# Patient Record
Sex: Female | Born: 1980 | Race: White | Hispanic: Yes | Marital: Single | Smoking: Never smoker
Health system: Southern US, Community
[De-identification: ages and names within clinical notes are randomized; demographics above are authoritative.]

## PROBLEM LIST (undated history)

## (undated) DIAGNOSIS — G4730 Sleep apnea, unspecified: Secondary | ICD-10-CM

## (undated) DIAGNOSIS — F419 Anxiety disorder, unspecified: Secondary | ICD-10-CM

## (undated) DIAGNOSIS — M549 Dorsalgia, unspecified: Secondary | ICD-10-CM

## (undated) DIAGNOSIS — IMO0002 Reserved for concepts with insufficient information to code with codable children: Secondary | ICD-10-CM

## (undated) HISTORY — DX: Sleep apnea, unspecified: G47.30

## (undated) HISTORY — DX: Dorsalgia, unspecified: M54.9

## (undated) HISTORY — PX: GASTRIC RESTRICTION SURGERY: SHX653

## (undated) HISTORY — DX: Reserved for concepts with insufficient information to code with codable children: IMO0002

## (undated) HISTORY — DX: Anxiety disorder, unspecified: F41.9

---

## 2004-09-28 ENCOUNTER — Ambulatory Visit (HOSPITAL_BASED_OUTPATIENT_CLINIC_OR_DEPARTMENT_OTHER): Admission: RE | Admit: 2004-09-28 | Discharge: 2004-09-28 | Payer: Self-pay | Admitting: Otolaryngology

## 2006-03-09 ENCOUNTER — Ambulatory Visit (HOSPITAL_COMMUNITY): Admission: RE | Admit: 2006-03-09 | Discharge: 2006-03-09 | Payer: Self-pay | Admitting: Internal Medicine

## 2006-07-26 ENCOUNTER — Ambulatory Visit (HOSPITAL_BASED_OUTPATIENT_CLINIC_OR_DEPARTMENT_OTHER): Admission: RE | Admit: 2006-07-26 | Discharge: 2006-07-26 | Payer: Self-pay | Admitting: Otolaryngology

## 2006-08-04 ENCOUNTER — Ambulatory Visit: Payer: Self-pay | Admitting: Internal Medicine

## 2010-04-22 ENCOUNTER — Encounter: Admission: RE | Admit: 2010-04-22 | Discharge: 2010-06-01 | Payer: Self-pay | Admitting: General Surgery

## 2010-08-04 ENCOUNTER — Ambulatory Visit (HOSPITAL_COMMUNITY)
Admission: RE | Admit: 2010-08-04 | Discharge: 2010-08-04 | Payer: Self-pay | Source: Home / Self Care | Admitting: General Surgery

## 2010-08-24 ENCOUNTER — Encounter
Admission: RE | Admit: 2010-08-24 | Discharge: 2010-10-03 | Payer: Self-pay | Source: Home / Self Care | Attending: General Surgery | Admitting: General Surgery

## 2010-09-12 ENCOUNTER — Ambulatory Visit (HOSPITAL_COMMUNITY)
Admission: RE | Admit: 2010-09-12 | Discharge: 2010-09-14 | Payer: Self-pay | Source: Home / Self Care | Attending: General Surgery | Admitting: General Surgery

## 2010-09-18 LAB — CBC
HCT: 33 % — ABNORMAL LOW (ref 36.0–46.0)
HCT: 34.7 % — ABNORMAL LOW (ref 36.0–46.0)
Hemoglobin: 11.1 g/dL — ABNORMAL LOW (ref 12.0–15.0)
Hemoglobin: 11.5 g/dL — ABNORMAL LOW (ref 12.0–15.0)
MCH: 29.9 pg (ref 26.0–34.0)
MCH: 29.6 pg (ref 26.0–34.0)
MCHC: 33.6 g/dL (ref 30.0–36.0)
MCHC: 33.1 g/dL (ref 30.0–36.0)
MCV: 88.9 fL (ref 78.0–100.0)
MCV: 89.2 fL (ref 78.0–100.0)
Platelets: 287 10*3/uL (ref 150–400)
Platelets: 295 10*3/uL (ref 150–400)
RBC: 3.71 MIL/uL — ABNORMAL LOW (ref 3.87–5.11)
RBC: 3.89 MIL/uL (ref 3.87–5.11)
RDW: 13.4 % (ref 11.5–15.5)
RDW: 13.4 % (ref 11.5–15.5)
WBC: 11.9 10*3/uL — ABNORMAL HIGH (ref 4.0–10.5)
WBC: 9.8 10*3/uL (ref 4.0–10.5)

## 2010-09-18 LAB — DIFFERENTIAL
Basophils Absolute: 0 10*3/uL (ref 0.0–0.1)
Basophils Relative: 0 % (ref 0–1)
Eosinophils Absolute: 0 10*3/uL (ref 0.0–0.7)
Eosinophils Relative: 0 % (ref 0–5)
Lymphocytes Relative: 28 % (ref 12–46)
Lymphs Abs: 3.3 10*3/uL (ref 0.7–4.0)
Monocytes Absolute: 0.9 10*3/uL (ref 0.1–1.0)
Monocytes Relative: 8 % (ref 3–12)
Neutro Abs: 7.7 10*3/uL (ref 1.7–7.7)
Neutrophils Relative %: 64 % (ref 43–77)

## 2010-09-18 LAB — POTASSIUM: Potassium: 4.1 mEq/L (ref 3.5–5.1)

## 2010-09-24 ENCOUNTER — Encounter: Payer: Self-pay | Admitting: General Surgery

## 2010-10-03 ENCOUNTER — Encounter: Admit: 2010-10-03 | Discharge: 2010-10-03 | Payer: Self-pay | Attending: General Surgery | Admitting: General Surgery

## 2010-11-06 ENCOUNTER — Ambulatory Visit: Payer: Self-pay | Admitting: *Deleted

## 2010-11-13 LAB — COMPREHENSIVE METABOLIC PANEL
ALT: 11 U/L (ref 0–35)
AST: 16 U/L (ref 0–37)
Albumin: 3.7 g/dL (ref 3.5–5.2)
Alkaline Phosphatase: 66 U/L (ref 39–117)
BUN: 10 mg/dL (ref 6–23)
CO2: 26 mEq/L (ref 19–32)
Calcium: 8.9 mg/dL (ref 8.4–10.5)
Chloride: 102 mEq/L (ref 96–112)
Creatinine, Ser: 0.64 mg/dL (ref 0.4–1.2)
GFR calc Af Amer: >60 mL/min (ref >60)
GFR calc non Af Amer: >60 mL/min (ref >60)
Glucose, Bld: 75 mg/dL (ref 70–99)
Potassium: 3.3 mEq/L — ABNORMAL LOW (ref 3.5–5.1)
Sodium: 137 mEq/L (ref 135–145)
Total Bilirubin: 0.4 mg/dL (ref 0.3–1.2)
Total Protein: 7.2 g/dL (ref 6.0–8.3)

## 2010-11-13 LAB — CBC
HCT: 37.4 % (ref 36.0–46.0)
Hemoglobin: 12.3 g/dL (ref 12.0–15.0)
MCH: 29.6 pg (ref 26.0–34.0)
MCHC: 32.9 g/dL (ref 30.0–36.0)
MCV: 89.9 fL (ref 78.0–100.0)
Platelets: 345 10*3/uL (ref 150–400)
RBC: 4.16 MIL/uL (ref 3.87–5.11)
RDW: 13.4 % (ref 11.5–15.5)
WBC: 7.9 10*3/uL (ref 4.0–10.5)

## 2010-11-13 LAB — DIFFERENTIAL
Basophils Absolute: 0 10*3/uL (ref 0.0–0.1)
Basophils Relative: 1 % (ref 0–1)
Eosinophils Absolute: 0.4 10*3/uL (ref 0.0–0.7)
Eosinophils Relative: 5 % (ref 0–5)
Lymphocytes Relative: 36 % (ref 12–46)
Lymphs Abs: 2.8 10*3/uL (ref 0.7–4.0)
Monocytes Absolute: 0.5 10*3/uL (ref 0.1–1.0)
Monocytes Relative: 6 % (ref 3–12)
Neutro Abs: 4.2 10*3/uL (ref 1.7–7.7)
Neutrophils Relative %: 53 % (ref 43–77)

## 2010-11-13 LAB — SURGICAL PCR SCREEN
MRSA, PCR: NEGATIVE
Staphylococcus aureus: NEGATIVE

## 2010-11-13 LAB — PREGNANCY, URINE: Preg Test, Ur: NEGATIVE

## 2010-11-28 ENCOUNTER — Encounter: Payer: Medicare HMO | Attending: General Surgery | Admitting: *Deleted

## 2010-11-28 DIAGNOSIS — Z9884 Bariatric surgery status: Secondary | ICD-10-CM | POA: Insufficient documentation

## 2010-11-28 DIAGNOSIS — Z09 Encounter for follow-up examination after completed treatment for conditions other than malignant neoplasm: Secondary | ICD-10-CM | POA: Insufficient documentation

## 2010-11-28 DIAGNOSIS — Z713 Dietary counseling and surveillance: Secondary | ICD-10-CM | POA: Insufficient documentation

## 2011-01-10 ENCOUNTER — Ambulatory Visit: Payer: Medicare HMO | Admitting: *Deleted

## 2011-01-19 NOTE — Procedures (Signed)
NAMECOLINE, Anna Pitts                  ACCOUNT NO.:  [id number]   MEDICAL RECORD NO.:  [id number]          PATIENT TYPE:  OUT   LOCATION:  SLEEP CENTER                 FACILITY:  [facility]   PHYSICIAN:  Clinton D. Maple Hudson, M.D. DATE OF BIRTH:  11-24-1980   DATE OF STUDY:                              NOCTURNAL POLYSOMNOGRAM   STUDY DATE:  September 28, 2004   REFERRING PHYSICIAN:  Dr. Suzanna Obey   INDICATION FOR STUDY:  Hypersomnia with sleep apnea.  Epworth Sleepiness  Score 19/24.  BMI 38.9.  Weight 220 pounds.   SLEEP ARCHITECTURE:  Total sleep time 327 minutes with sleep efficiency 72%.  Stage I was 15%, stage II 58%, stages III and IV 15%, REM was 12% of total  sleep time.  Sleep latency 31 minutes, REM latency 185 minutes, awake after  sleep onset 96 minutes, arousal index increased at 36.  No medications were  taken.   RESPIRATORY DATA:  RDI 62.2 obstructive events per hour indicating severe  obstructive sleep apnea/hypopnea syndrome.  This included 2 central apneas,  103 obstructive apneas, 234 hypopneas.  Events were not positional.  REM RDI  77 per hour.  Split-study protocol CPAP titration could not be carried out  because the patient could not achieve the required 2 hours of sleep before  1:30 a.m. to allow time for CPAP titration with confirmed diagnosis.  Most  obstructive events developed after 2 a.m.   OXYGEN DATA:  Moderate snoring with oxygen desaturation to a nadir of 85%.  Mean oxygen saturation through the study was 97% on room air.   CARDIAC DATA:  Normal sinus rhythm.   MOVEMENT/PARASOMNIA:  Occasional leg jerks with insignificant effect on  sleep.   IMPRESSION/RECOMMENDATION:  Severe obstructive sleep apnea/hypopnea  syndrome, RDI 62.2 per hour with oxygen desaturation to 85% during events.  Patient unable to sustain sleep initially for continuous positive airway  pressure titration.  Consider return for continuous positive airway pressure  titration study or  evaluate for alternative therapies as appropriate.     CDY/MEDQ  D:  10/01/2004 13:13:56  T:  10/01/2004 20:52:07  Job:  [id number]

## 2011-01-19 NOTE — Procedures (Signed)
Anna Pitts, Anna Pitts                  ACCOUNT NO.:  [id number]   MEDICAL RECORD NO.:  [id number]          PATIENT TYPE:  OUT   LOCATION:  SLEEP CENTER                 FACILITY:  [facility]   PHYSICIAN:  Clinton D. Maple Hudson, MD, FCCP, FACPDATE OF BIRTH:  February 16[birth year] 1981   DATE OF STUDY:  07/26/2006                            NOCTURNAL POLYSOMNOGRAM   INDICATION FOR STUDY:  Hypersomnia with sleep apnea.   EPWORTH SLEEPINESS SCORE:  12/24, BMI 40.7, weight 230 pounds.   HOME MEDICATIONS:  1. Birth control pills.  2. Advil.  3. Allergy medication.   MEDICATIONS:  No bedtime medication was reported.   A diagnostic NPSG on September 28, 2004 recorded an AHI of 62.2 per hour.   SLEEP ARCHITECTURE:  Total sleep time 391 minutes with sleep efficiency  84%.  Stage 1 was 8%, stage 2 62%, stages 3 and 4 17%, REM 13% of total  sleep time.  Sleep latency 10 minutes.  REM latency 134 minutes.  Awake  after sleep onset 64 minutes. Arousal index 32 indicating increased  sleep fragmentation in the first half of the night before CPAP.   RESPIRATORY DATA:  Split study protocol.  Apnea/hypopnea index (AHI/RDI)  38 per hour indicating moderate obstructive sleep apnea/hypopnea  syndrome before CPAP.  This included 95 obstructive apnea's and 47  hypopnea's before CPAP.  The events were not positional.  REM AHI 0.  CPAP was titrated to 10 CWP, AHI 0 per hour.  A petite Respironics'  Comfort Gel mask was used with heated humidifier.   OXYGEN DATA:  Moderate snoring with oxygen desaturation to a nadir of  83%.  Mean oxygen saturation after CPAP control 97% on room air.   CARDIAC DATA:  Normal sinus rhythm.   MOVEMENT-PARASOMNIA:  A total of 78 limb jerks were recorded of which 27  were associated with arousal or awakening for periodic limb movement  with arousal index of 4.1 per hour which is mildly increased.   IMPRESSIONS-RECOMMENDATIONS:  1. Moderate obstructive sleep apnea/hypopnea syndrome, AHI 38 per hour   with non positional events, moderate snoring and oxygen      desaturation to 83%.  2. Successful CPAP titration to 10 CWP, AHI 0 per hour.  A petite      Respironics' Comfort Gel mask was used with a heated humidifier.  3. Prior diagnostic NPSG on September 28, 2004 recorded an AHI of 62.2      per hour.  4. Periodic limb movement with arousal, 4.1 per hour.      Clinton D. Maple Hudson, MD, [facility], FACP  Diplomate, Biomedical engineer of Sleep Medicine  Electronically Signed     CDY/MEDQ  D:  08/04/2006 13:28:26  T:  08/05/2006 11:03:14  Job:  [id number]

## 2011-02-09 ENCOUNTER — Encounter (INDEPENDENT_AMBULATORY_CARE_PROVIDER_SITE_OTHER): Payer: Self-pay | Admitting: General Surgery

## 2011-03-09 ENCOUNTER — Encounter (INDEPENDENT_AMBULATORY_CARE_PROVIDER_SITE_OTHER): Payer: Self-pay

## 2011-03-09 ENCOUNTER — Encounter (INDEPENDENT_AMBULATORY_CARE_PROVIDER_SITE_OTHER): Payer: Medicare HMO

## 2011-03-09 ENCOUNTER — Ambulatory Visit (INDEPENDENT_AMBULATORY_CARE_PROVIDER_SITE_OTHER): Payer: Private Health Insurance - Indemnity | Admitting: Physician Assistant

## 2011-03-09 VITALS — BP 122/80 | Ht 63.0 in | Wt 194.4 lb

## 2011-03-09 DIAGNOSIS — Z4651 Encounter for fitting and adjustment of gastric lap band: Secondary | ICD-10-CM

## 2011-03-09 NOTE — Patient Instructions (Signed)
Take clear liquids for the next 48 hours. Thin protein shakes are ok to start on Saturday evening. Call us if you have persistent vomiting or regurgitation, night cough or reflux symptoms. Return as scheduled or sooner if you notice no changes in hunger/portion sizes.   

## 2011-03-09 NOTE — Progress Notes (Signed)
  HISTORY: Anna Pitts is a 30 y.o.female who received an AP-Standard lap-band in January 2012 by Dr. Johna Sheriff.The patient states that she believes she is eating larger portions then she would suspect. However she has been taking antibiotics and prednisone for an upper respiratory infection recently. She denies persistent nausea vomiting and regurgitation symptoms. She has been taking many sugar-containing liquids with her recent illness.  VITAL SIGNS: Filed Vitals:   03/09/11 1459  BP: 122/80    PHYSICAL EXAM: Physical exam reveals a very well-appearing 30 y.o.female in no apparent distress Neurologic: Awake, alert, oriented Psych: Bright affect, conversant Respiratory: Breathing even and unlabored. No stridor or wheezing Abdomen: Soft, nontender, nondistended to palpation. Incisions well-healed. No incisional hernias. Port easily palpated. Extremities: Atraumatic, good range of motion.  ASSESMENT: 30 y.o.  female  s/p AP-Standard lap-band. She requested an adjustment today  PLAN: The patient's port was accessed with a 20G Huber needle without difficulty. Clear fluid was aspirated and 0.5 mL saline was added to the port to give a total volume of 5.5 mL. The patient was able to swallow water without difficulty following the procedure and was instructed to take clear liquids for the next 24-48 hours and advance slowly as tolerated.

## 2011-04-06 ENCOUNTER — Encounter (INDEPENDENT_AMBULATORY_CARE_PROVIDER_SITE_OTHER): Payer: Private Health Insurance - Indemnity

## 2011-04-13 ENCOUNTER — Encounter (INDEPENDENT_AMBULATORY_CARE_PROVIDER_SITE_OTHER): Payer: Self-pay

## 2011-04-13 ENCOUNTER — Ambulatory Visit (INDEPENDENT_AMBULATORY_CARE_PROVIDER_SITE_OTHER): Payer: Private Health Insurance - Indemnity | Admitting: Physician Assistant

## 2011-04-13 NOTE — Patient Instructions (Signed)
Return in 1 month

## 2011-04-13 NOTE — Progress Notes (Signed)
  HISTORY: EMILLEE TALSMA is a 30 y.o.female who received an AP-Standard lap-band in January 2012 by Dr. Johna Sheriff. She comes in today finally feeling some restriction but again says her work is getting in the way of being able to lose weight. She's gained 6 lbs since the last visit. She says the upcoming months will be spent working from home so she's able to concentrate more on her diet and physical activity. She's had three episodes of regurgitation in the past month.  VITAL SIGNS: Filed Vitals:   04/13/11 1127  BP: 118/80    PHYSICAL EXAM: Physical exam reveals a very well-appearing 30 y.o.female in no apparent distress Neurologic: Awake, alert, oriented Psych: Bright affect, conversant Respiratory: Breathing even and unlabored. No stridor or wheezing Extremities: Atraumatic, good range of motion. Skin: Warm, Dry, no rashes Musculoskeletal: Normal gait, Joints normal  ASSESMENT: 30 y.o.  female  s/p AP-Standard lap-band.   PLAN: We talked at length of the need to focus on appropriate foods and exercise. The patient lobbied for a fill but I explained that it probably wasn't in her best interest at this point. Her biggest hurdle will be diet change. We agreed to concentrate on those items this month then re-evaluate next visit.

## 2011-05-11 ENCOUNTER — Encounter (INDEPENDENT_AMBULATORY_CARE_PROVIDER_SITE_OTHER): Payer: Private Health Insurance - Indemnity

## 2011-05-30 ENCOUNTER — Encounter (INDEPENDENT_AMBULATORY_CARE_PROVIDER_SITE_OTHER): Payer: Self-pay | Admitting: Physician Assistant

## 2011-08-03 ENCOUNTER — Encounter (INDEPENDENT_AMBULATORY_CARE_PROVIDER_SITE_OTHER): Payer: Self-pay

## 2011-08-03 ENCOUNTER — Ambulatory Visit (INDEPENDENT_AMBULATORY_CARE_PROVIDER_SITE_OTHER): Payer: Private Health Insurance - Indemnity | Admitting: Physician Assistant

## 2011-08-03 VITALS — BP 120/74 | Ht 63.0 in | Wt 194.8 lb

## 2011-08-03 DIAGNOSIS — Z4651 Encounter for fitting and adjustment of gastric lap band: Secondary | ICD-10-CM

## 2011-08-03 DIAGNOSIS — E669 Obesity, unspecified: Secondary | ICD-10-CM

## 2011-08-03 DIAGNOSIS — R109 Unspecified abdominal pain: Secondary | ICD-10-CM

## 2011-08-03 NOTE — Progress Notes (Signed)
  HISTORY: Anna Pitts is a 30 y.o.female who received an AP-Standard lap-band in January 2012 by Dr. Johna Sheriff. She is experiencing increased hunger and portion sizes but no vomiting or regurgitation. She is also complaining of recent pain near her left lower abdominal incision that worsens with exercise and stretching. She was stretching one day and felt a severe stabbing pain that self-resolved. It recurs with activity. No constipation or obstipation.  VITAL SIGNS: Filed Vitals:   08/03/11 1500  BP: 120/74    PHYSICAL EXAM: Physical exam reveals a very well-appearing 30 y.o.female in no apparent distress Neurologic: Awake, alert, oriented Psych: Bright affect, conversant Respiratory: Breathing even and unlabored. No stridor or wheezing Abdomen: Soft, nontender, nondistended to palpation. Incisions well-healed. No incisional hernias. Port easily palpated. Extremities: Atraumatic, good range of motion.  ASSESMENT: 30 y.o.  female  s/p AP-Standard lap-band.   PLAN: The patient's port was accessed with a 20G Huber needle without difficulty. Clear fluid was aspirated and 0.5 mL saline was added to the port to give a total predicted volume of 6 mL. The patient was able to swallow water without difficulty following the procedure and was instructed to take clear liquids for the next 24-48 hours and advance slowly as tolerated. I've ordered a CT abdomen oral contrast only to evaluate for possible incisional hernia. None was felt on exam.

## 2011-08-03 NOTE — Patient Instructions (Signed)
Take clear liquids for the next 48 hours. Thin protein shakes are ok to start on Saturday evening. Call us if you have persistent vomiting or regurgitation, night cough or reflux symptoms. Return as scheduled or sooner if you notice no changes in hunger/portion sizes.  Obtain CT scan. Call or return if abdominal wall pain worsens or if you have difficulty moving your bowels.

## 2011-08-13 ENCOUNTER — Ambulatory Visit
Admission: RE | Admit: 2011-08-13 | Discharge: 2011-08-13 | Disposition: A | Discharge status: Home / Self Care | Payer: Private Health Insurance - Indemnity | Source: Ambulatory Visit | Attending: Physician Assistant | Admitting: Physician Assistant

## 2011-08-13 DIAGNOSIS — R109 Unspecified abdominal pain: Secondary | ICD-10-CM

## 2011-08-31 ENCOUNTER — Telehealth (INDEPENDENT_AMBULATORY_CARE_PROVIDER_SITE_OTHER): Payer: Self-pay | Admitting: General Surgery

## 2011-08-31 NOTE — Telephone Encounter (Signed)
Left message on vmail to advise to please call clinic back with regard to change in appointment time.

## 2011-09-28 ENCOUNTER — Encounter (INDEPENDENT_AMBULATORY_CARE_PROVIDER_SITE_OTHER): Payer: Private Health Insurance - Indemnity

## 2011-10-26 ENCOUNTER — Encounter (INDEPENDENT_AMBULATORY_CARE_PROVIDER_SITE_OTHER): Payer: Private Health Insurance - Indemnity | Admitting: General Surgery

## 2011-11-29 ENCOUNTER — Encounter (INDEPENDENT_AMBULATORY_CARE_PROVIDER_SITE_OTHER): Payer: Private Health Insurance - Indemnity

## 2011-12-06 ENCOUNTER — Encounter (INDEPENDENT_AMBULATORY_CARE_PROVIDER_SITE_OTHER): Payer: Private Health Insurance - Indemnity

## 2012-05-22 ENCOUNTER — Encounter (INDEPENDENT_AMBULATORY_CARE_PROVIDER_SITE_OTHER): Payer: Self-pay

## 2012-05-22 ENCOUNTER — Ambulatory Visit (INDEPENDENT_AMBULATORY_CARE_PROVIDER_SITE_OTHER): Payer: Private Health Insurance - Indemnity | Admitting: Physician Assistant

## 2012-05-22 VITALS — BP 100/60 | HR 98 | Ht 63.0 in | Wt 201.4 lb

## 2012-05-22 DIAGNOSIS — Z4651 Encounter for fitting and adjustment of gastric lap band: Secondary | ICD-10-CM

## 2012-05-22 NOTE — Patient Instructions (Signed)
Return in one month or sooner if needed

## 2012-05-22 NOTE — Progress Notes (Signed)
  HISTORY: Anna Pitts is a 31 y.o.female who received an AP-Standard lap-band in January 2012 by Dr. Johna Sheriff. She comes in having last been seen in November 2012. Since then she's not been very active and has been making poor food choices per her history. She's since enrolled in a fitness program at work but unfortunately she's been laid off from her position. She says she's been having difficulty keeping solid foods down and believes her band is too tight.  VITAL SIGNS: Filed Vitals:   05/22/12 1106  BP: 100/60  Pulse: 98    PHYSICAL EXAM: Physical exam reveals a very well-appearing 31 y.o.female in no apparent distress Neurologic: Awake, alert, oriented Psych: Bright affect, conversant Respiratory: Breathing even and unlabored. No stridor or wheezing Abdomen: Soft, nontender, nondistended to palpation. Incisions well-healed. No incisional hernias. Port easily palpated. Extremities: Atraumatic, good range of motion.  ASSESMENT: 31 y.o.  female  s/p AP-Standard lap-band.   PLAN: The patient's port was accessed with a 20G Huber needle without difficulty. Clear fluid was aspirated and 0.5 mL saline was removed from the port to give a total predicted volume of 5.5 mL. If she's still too restricted, I asked her to return in one week. Otherwise we'll have her back in one month for a follow-up check.

## 2012-06-19 ENCOUNTER — Encounter (INDEPENDENT_AMBULATORY_CARE_PROVIDER_SITE_OTHER): Payer: Self-pay

## 2012-06-19 ENCOUNTER — Ambulatory Visit (INDEPENDENT_AMBULATORY_CARE_PROVIDER_SITE_OTHER): Payer: Private Health Insurance - Indemnity | Admitting: Physician Assistant

## 2012-06-19 DIAGNOSIS — Z9884 Bariatric surgery status: Secondary | ICD-10-CM

## 2012-06-19 NOTE — Patient Instructions (Signed)
Return in 2 months. Focus on good food choices as well as physical activity. Return sooner if you have an increase in hunger, portion sizes or weight. Return also for difficulty swallowing, night cough, reflux.

## 2012-06-19 NOTE — Progress Notes (Signed)
  HISTORY: Anna Pitts is a 31 y.o.female who received an AP-Standard lap-band in January 2012 by Dr. Johna Sheriff. She comes in with 8 lbs of weight gain since her last visit, putting her almost 5 lbs over her pre-op weight. She says that her mother has just left to go back home after a three month visit. Culturally this has been very difficult as her mother has been doing most of the cooking and it's not acceptable to not eat. She says her band is at a good adjustment but her primary hurdle is food choices.  VITAL SIGNS: Filed Vitals:   06/19/12 0945  BP: 104/78  Pulse: 82  Temp: 97.7 F (36.5 C)    PHYSICAL EXAM: Physical exam reveals a very well-appearing 31 y.o.female in no apparent distress Neurologic: Awake, alert, oriented Psych: Bright affect, conversant Respiratory: Breathing even and unlabored. No stridor or wheezing Extremities: Atraumatic, good range of motion. Skin: Warm, Dry, no rashes Musculoskeletal: Normal gait, Joints normal  ASSESMENT: 31 y.o.  female  s/p AP-Standard lap-band.   PLAN: As her band seems to be doing its job, she is going to focus on food choices and exercise. As such, we agreed to meet again in two months to evaluate progress. At that time, if she needs an adjustment, we will do that.

## 2012-08-21 ENCOUNTER — Encounter (INDEPENDENT_AMBULATORY_CARE_PROVIDER_SITE_OTHER): Payer: Private Health Insurance - Indemnity

## 2016-12-28 ENCOUNTER — Other Ambulatory Visit (HOSPITAL_COMMUNITY): Payer: Self-pay | Admitting: General Surgery

## 2017-01-16 ENCOUNTER — Ambulatory Visit: Payer: Private Health Insurance - Indemnity | Admitting: Registered"

## 2017-01-23 ENCOUNTER — Ambulatory Visit (HOSPITAL_COMMUNITY): Payer: Private Health Insurance - Indemnity

## 2017-01-23 ENCOUNTER — Other Ambulatory Visit (HOSPITAL_COMMUNITY): Payer: Private Health Insurance - Indemnity

## 2017-02-01 ENCOUNTER — Encounter: Payer: Private Health Insurance - Indemnity | Attending: General Surgery | Admitting: Registered"

## 2017-02-01 ENCOUNTER — Encounter: Payer: Self-pay | Admitting: Registered"

## 2017-02-01 DIAGNOSIS — Z6841 Body Mass Index (BMI) 40.0 and over, adult: Secondary | ICD-10-CM | POA: Diagnosis not present

## 2017-02-01 DIAGNOSIS — Z713 Dietary counseling and surveillance: Secondary | ICD-10-CM | POA: Diagnosis not present

## 2017-02-01 DIAGNOSIS — E669 Obesity, unspecified: Secondary | ICD-10-CM

## 2017-02-01 NOTE — Progress Notes (Signed)
Pre-Op Assessment Visit:  Pre-Operative Sleeve Gastrectomy Surgery  Medical Nutrition Therapy:  Appt start time: 10:35  End time:  11:45  Patient was seen on 02/01/2017 for Pre-Operative Nutrition Assessment. Assessment and letter of approval faxed to [facility] coordinator on 02/01/2017.   Pt expectation of surgery: feeling better, increase energy level to play with 2 yr daughter, healthier lifestyle   Pt expectation of Dietitian: accountability especially when getting off track  Start weight at [facility]: 238.1 BMI: 42.51   Pt states is talkative. Pt has lap band in 2012 and states she is in a better place to make the necessary changes for conversion to sleeve. Pt states she experiences pain when walking and exercising. Pt states she "doesn't eat anything that has a heart, no meat and fish due to spiritual and religious reasons". Pt reports she has started her dietary changes in Feb 2018 around the time when a 9 year relationship ended. Pt reports trying not to take medicine and prefers to do things wholistically. Pt states her lowest weight on lap band was 176 lbs. Pt states her family is supportive. Pt reports she is eliminating dairy from her diet as well.   Referral paperwork states pt needs 1 nutrition assessment and 3-4 office visits with PCP within 1 year. Pt is unsure how many visits needed prior to surgery according to insurance. Pt reports she will contact [facility] to verify and make next appt with us when needed.     24 hr Dietary Recall: First Meal: small croissant Snack: fruit Second Meal: veggie sandwich ([employer]) or tomato soup, mediterranean flatbread ([employer]) Snack: yogurt w/ granola, crackers or protein shake Third Meal: beans, salad, small portion of rice, avocado Snack: none Beverages: coffee, water  Encouraged to engage in 150 minutes of moderate physical activity including cardiovascular and weight baring weekly  Handouts given  during visit include:  . Pre-Op Goals . Bariatric Surgery Protein Shakes . Vitamin and Mineral Recommendations  During the appointment today the following Pre-Op Goals were reviewed with the patient: . Maintain or lose weight as instructed by your surgeon . Make healthy food choices . Begin to limit portion sizes . Limited concentrated sugars and fried foods . Keep fat/sugar in the single digits per serving on          food labels . Practice CHEWING your food  (aim for 30 chews per bite or until applesauce consistency) . Practice not drinking 15 minutes before, during, and 30 minutes after each meal/snack . Avoid all carbonated beverages  . Avoid/limit caffeinated beverages  . Avoid all sugar-sweetened beverages . Consume 3 meals per day; eat every 3-5 hours . Make a list of non-food related activities . Aim for 64-100 ounces of FLUID daily  . Aim for at least 60-80 grams of PROTEIN daily . Look for a liquid protein source that contain ?15 g protein and ?5 g carbohydrate  (ex: shakes, drinks, shots) . Physical activity is an important part of a healthy lifestyle so keep it moving!  Follow diet recommendations listed below Energy and Macronutrient Recommendations: Calories: 1600 Carbohydrate: 180 Protein: 120 Fat: 44  Demonstrated degree of understanding via:  Teach Back   Teaching Method Utilized:  Visual Auditory Hands on  Barriers to learning/adherence to lifestyle change: none  Patient to call the Nutrition [facility] Education Services to enroll in Pre-Op and Post-Op Nutrition Education when surgery date is scheduled.

## 2017-02-13 ENCOUNTER — Ambulatory Visit (HOSPITAL_COMMUNITY)
Admission: RE | Admit: 2017-02-13 | Discharge: 2017-02-13 | Disposition: A | Discharge status: Home / Self Care | Payer: BLUE CROSS/BLUE SHIELD | Source: Ambulatory Visit | Attending: General Surgery | Admitting: General Surgery

## 2019-04-03 IMAGING — DX DG CHEST 2V
2 series · 2 of 2 positions shown · non-contrast
Comparison: Chest x-ray of August 16, 2015

CLINICAL DATA: Pre bariatric workup. No known cardiopulmonary
abnormalities. No current complaints. Nonsmoker.

EXAM:
CHEST  2 VIEW

[chest pa]
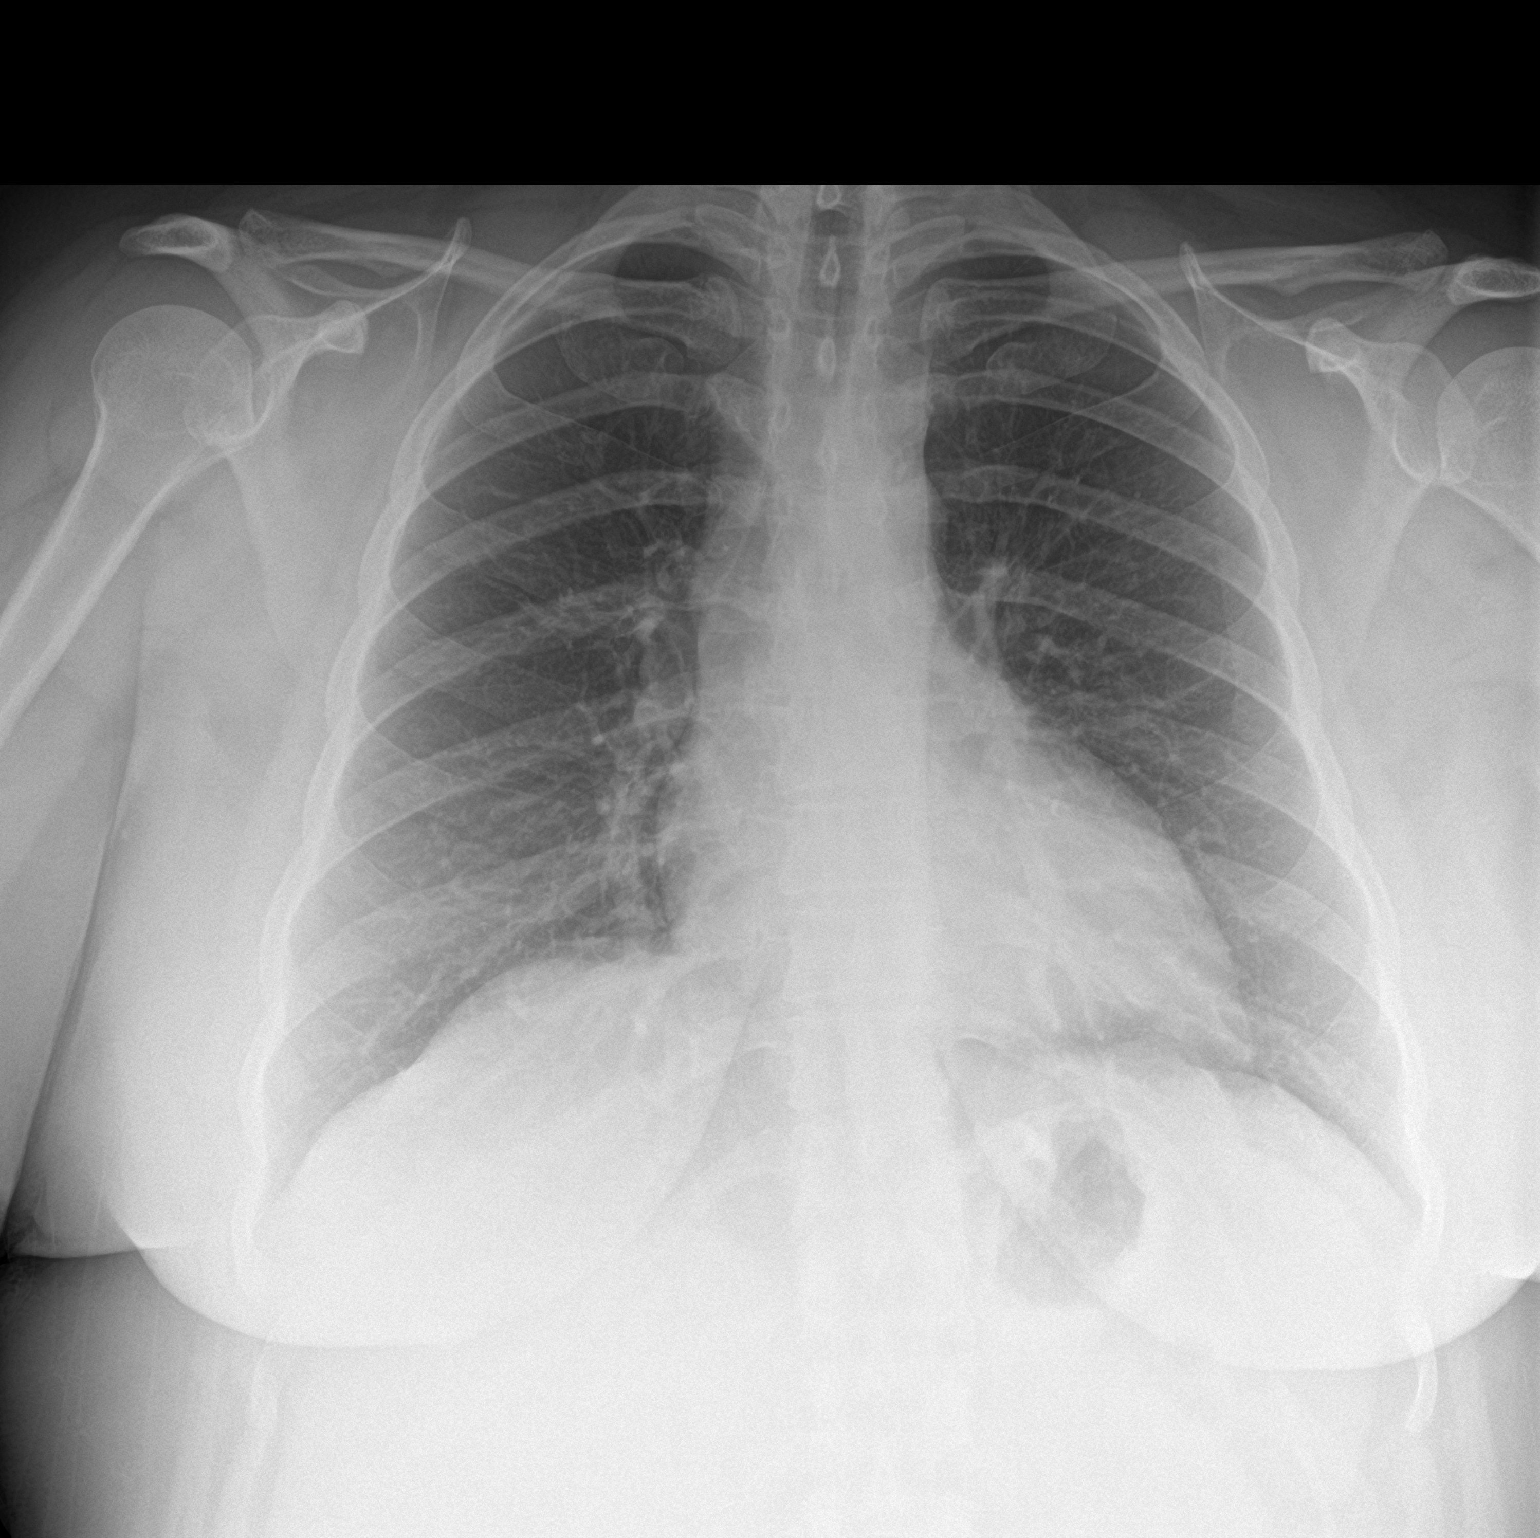

[chest lat]
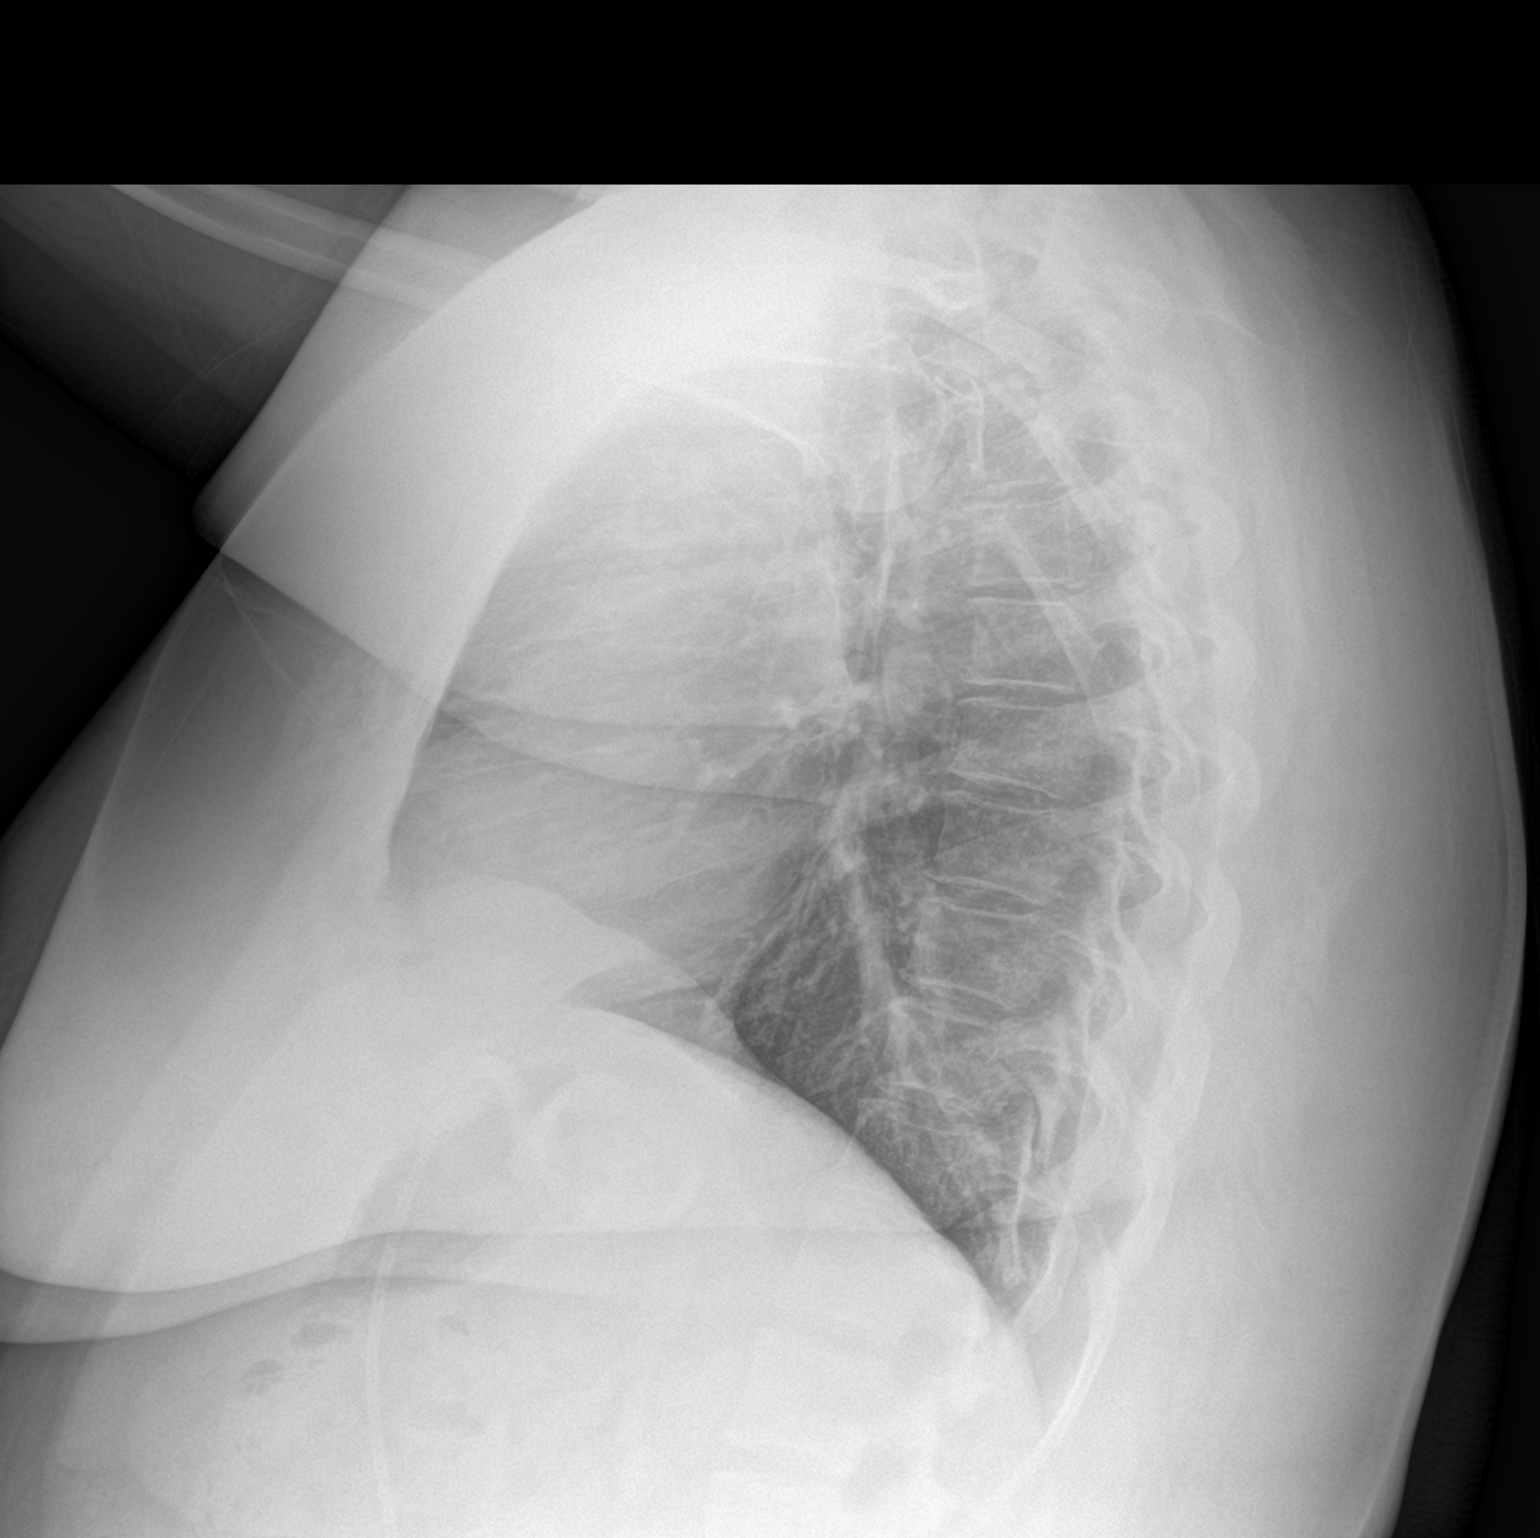

[2 of 2 positions shown; findings below may reference images not displayed]

FINDINGS: The lungs are well-expanded and clear. The heart and pulmonary
vascularity are normal. The mediastinum is normal in width. There is
no pleural effusion. The bony thorax exhibits no acute abnormality.
An adjustable restriction device at the GE junction is present. The
bony thorax is unremarkable.
IMPRESSION: There is no active cardiopulmonary disease.

## 2019-04-03 IMAGING — RF DG UGI W/ KUB
7 of 14 series · 7 of 14 positions shown · non-contrast
Comparison: Abdominal radiograph 09/13/2010.

CLINICAL DATA: 36-year-old female with history of LapBand under
preoperative evaluation for possible sleeve gastrectomy.

EXAM:
UPPER GI SERIES WITH KUB
TECHNIQUE: After obtaining a scout radiograph a routine upper GI series was
performed using thin barium.
FLUOROSCOPY TIME:  Fluoroscopy Time:  1.9 minutes
Radiation Exposure Index (if provided by the fluoroscopic device):
59.8 mGy
Number of Acquired Spot Images: 0

[Series 1: t abdomen supine · 0.15mm/px · 1 of 1 slices shown]
[im 1/1]
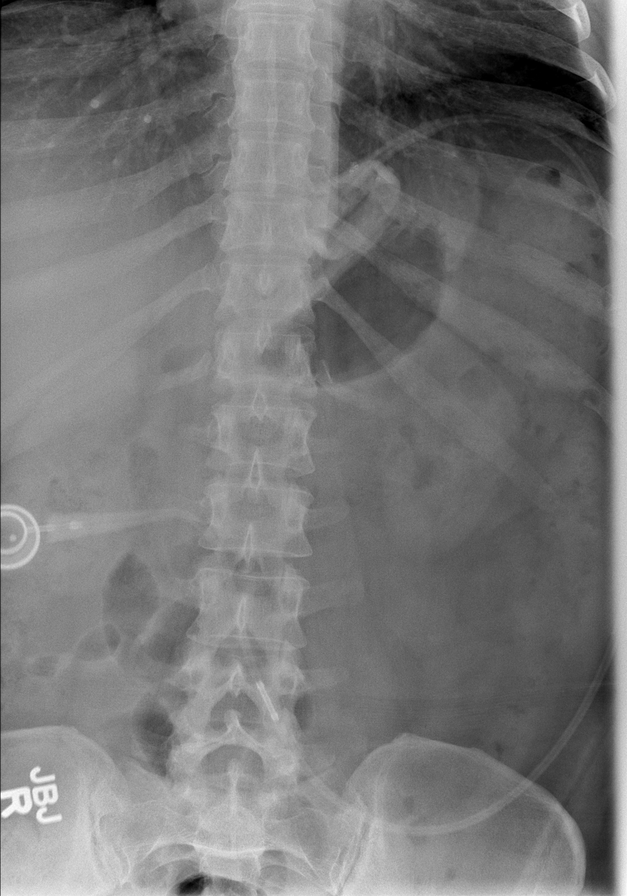

[Series 2: fluoro_barium 2fps_bw · 0.17mm/px · 1 of 1 slices shown (1 of 6)]
[im 1/1]
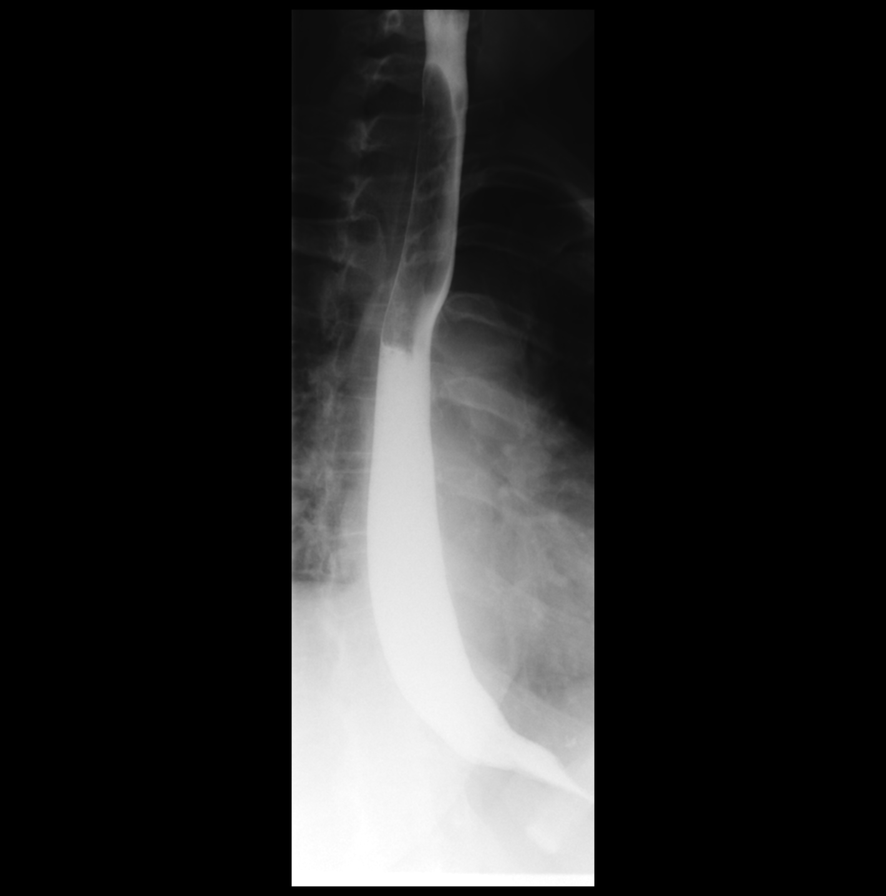

[Series 3: fluoro_barium 2fps_bw · 0.17mm/px · 1 of 1 slices shown (2 of 6)]
[im 1/1]
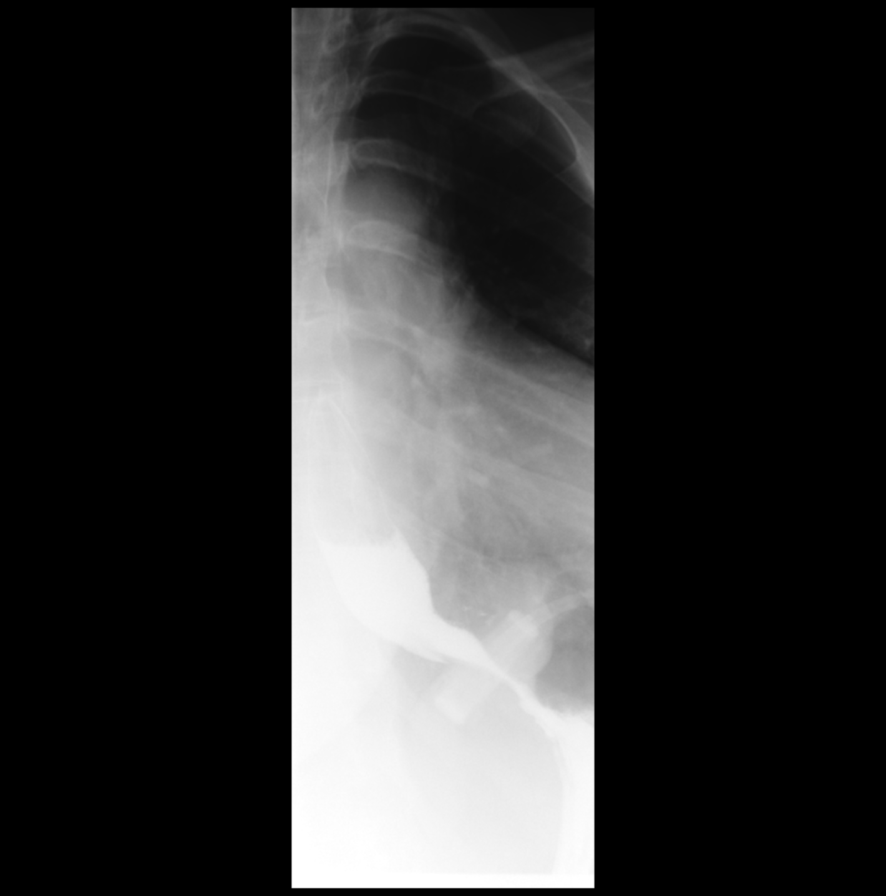

[Series 4: fluoro_barium 2fps_bw · 0.17mm/px · 1 of 1 slices shown (3 of 6)]
[im 1/1]
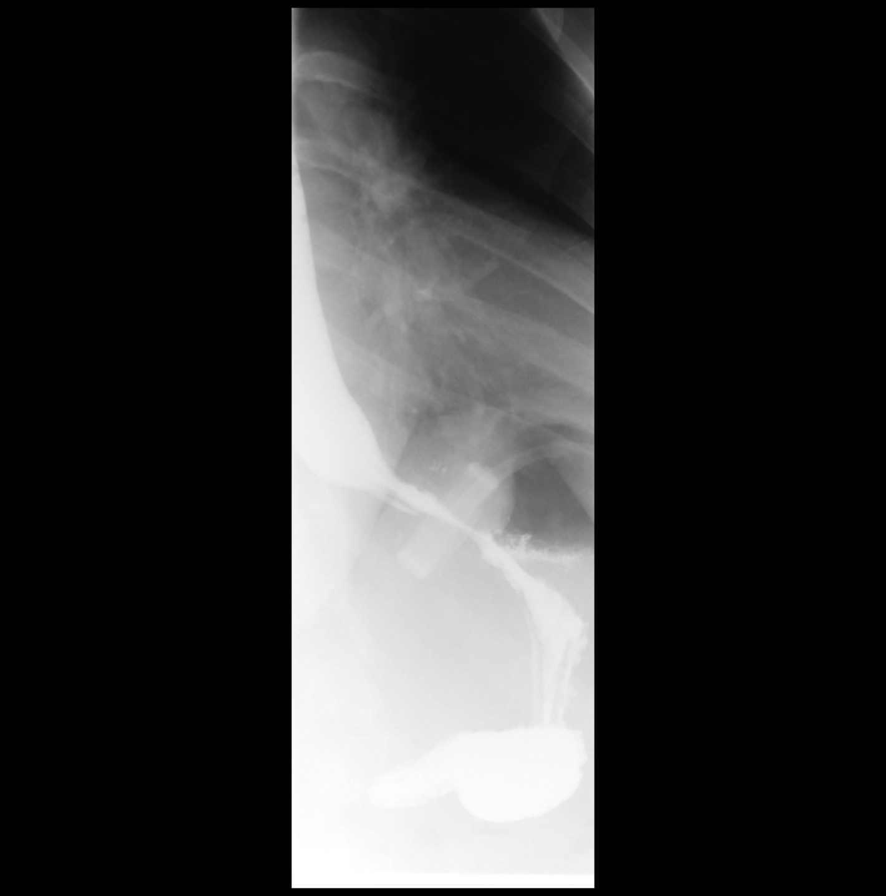

[Series 5: fluoro_barium 2fps_bw · 0.17mm/px · 1 of 1 slices shown (4 of 6)]
[im 1/1]
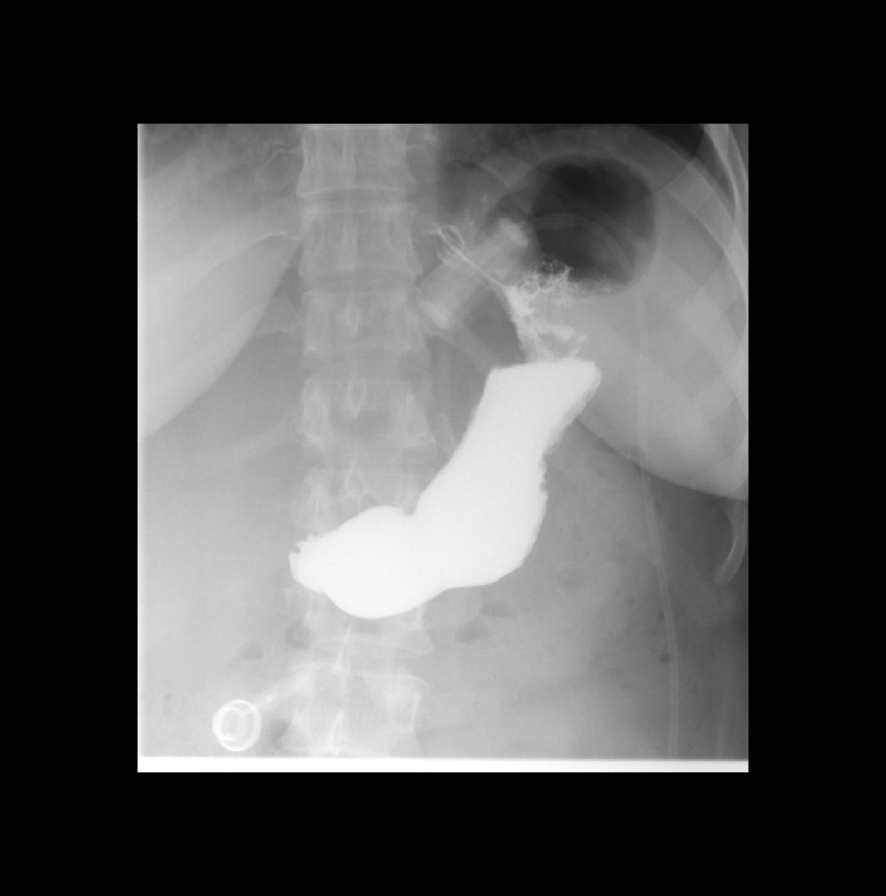

[Series 6: fluoro_barium 2fps_bw · 0.18mm/px · 1 of 1 slices shown (5 of 6)]
[im 1/1]
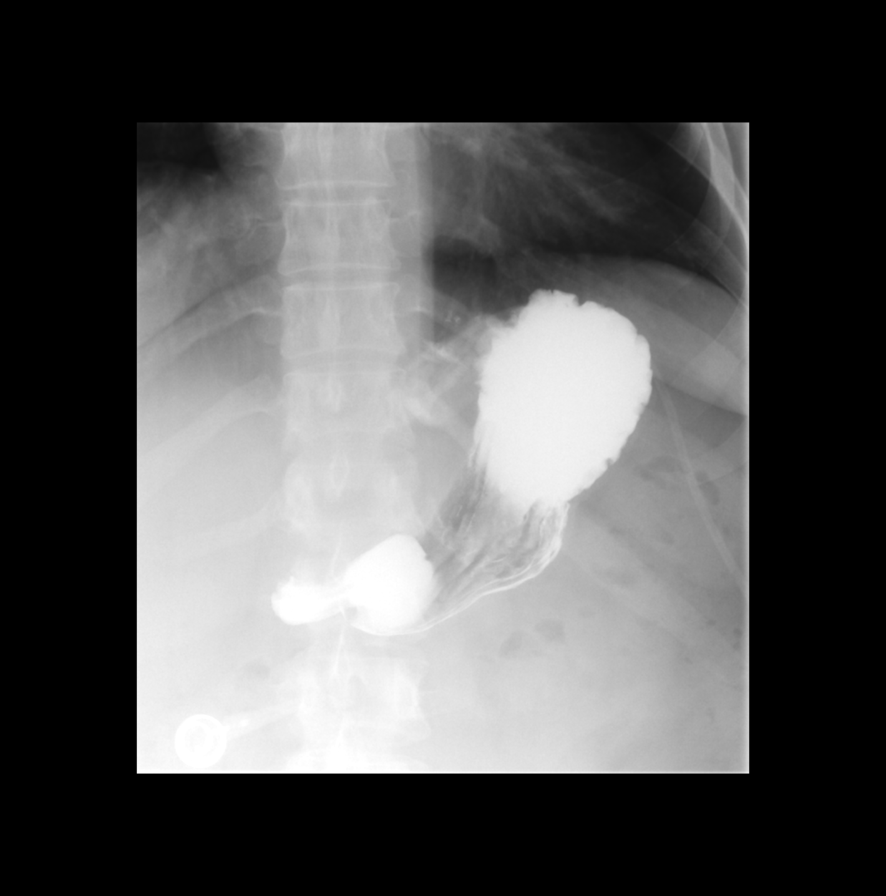

[Series 7: fluoro_barium 2fps_bw · 0.18mm/px · 1 of 1 slices shown (6 of 6)]
[im 1/1]
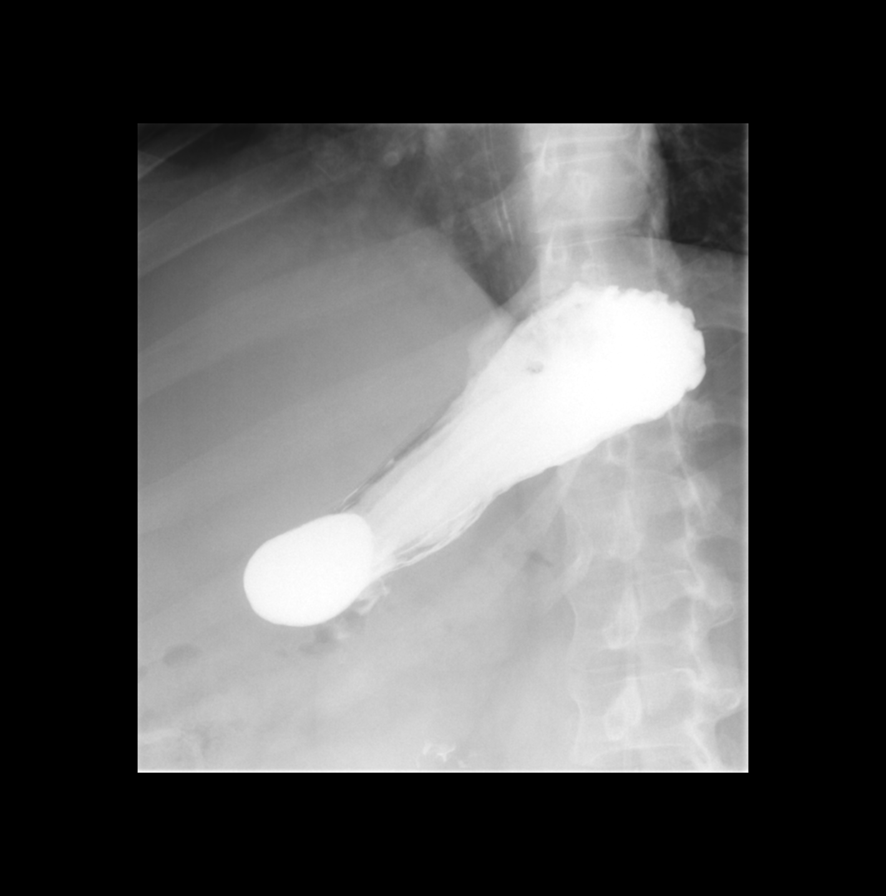

[7 of 14 positions shown; findings below may reference images not displayed]

FINDINGS: LapBand in place with phi angle of 44 degrees (normal). Bowel gas
pattern is nonobstructive.

Single contrast images of the esophagus demonstrate normal anatomy
and normal motility. Contrast readily traversed the LapBand and
extended into the stomach, which filled readily and was grossly
normal in appearance. No definite gastric mass or ulcer identified
on today's single contrast examination. Contrast readily traversed
the pylorus and extended into the duodenum which was grossly normal
in appearance.
IMPRESSION: 1. LapBand appears appropriately positioned, as above.
2. Otherwise normal upper GI.
